# Patient Record
Sex: Female | Born: 1960 | Race: White | Hispanic: No | Marital: Single | Smoking: Former smoker
Health system: Southern US, Community
[De-identification: ages and names within clinical notes are randomized; demographics above are authoritative.]

## PROBLEM LIST (undated history)

## (undated) DIAGNOSIS — F419 Anxiety disorder, unspecified: Secondary | ICD-10-CM

## (undated) DIAGNOSIS — K219 Gastro-esophageal reflux disease without esophagitis: Secondary | ICD-10-CM

## (undated) DIAGNOSIS — F32A Depression, unspecified: Secondary | ICD-10-CM

## (undated) DIAGNOSIS — F329 Major depressive disorder, single episode, unspecified: Secondary | ICD-10-CM

## (undated) DIAGNOSIS — F1910 Other psychoactive substance abuse, uncomplicated: Secondary | ICD-10-CM

## (undated) HISTORY — DX: Major depressive disorder, single episode, unspecified: F32.9

## (undated) HISTORY — DX: Depression, unspecified: F32.A

## (undated) HISTORY — DX: Gastro-esophageal reflux disease without esophagitis: K21.9

## (undated) HISTORY — DX: Other psychoactive substance abuse, uncomplicated: F19.10

## (undated) HISTORY — DX: Anxiety disorder, unspecified: F41.9

---

## 1998-06-12 HISTORY — PX: TUBAL LIGATION: SHX77

## 2003-05-22 ENCOUNTER — Ambulatory Visit (HOSPITAL_COMMUNITY): Admission: RE | Admit: 2003-05-22 | Discharge: 2003-05-22 | Payer: Self-pay | Admitting: Gastroenterology

## 2003-05-22 ENCOUNTER — Encounter (INDEPENDENT_AMBULATORY_CARE_PROVIDER_SITE_OTHER): Payer: Self-pay | Admitting: *Deleted

## 2004-04-18 ENCOUNTER — Ambulatory Visit: Payer: Self-pay | Admitting: Gastroenterology

## 2004-05-19 ENCOUNTER — Ambulatory Visit: Payer: Self-pay | Admitting: Gastroenterology

## 2004-07-04 ENCOUNTER — Ambulatory Visit: Payer: Self-pay | Admitting: Internal Medicine

## 2004-08-01 ENCOUNTER — Ambulatory Visit: Payer: Self-pay | Admitting: Internal Medicine

## 2004-09-01 ENCOUNTER — Ambulatory Visit: Payer: Self-pay | Admitting: Internal Medicine

## 2004-10-27 ENCOUNTER — Ambulatory Visit: Payer: Self-pay | Admitting: Internal Medicine

## 2004-11-24 ENCOUNTER — Ambulatory Visit: Payer: Self-pay | Admitting: Internal Medicine

## 2007-01-14 ENCOUNTER — Other Ambulatory Visit: Admission: RE | Admit: 2007-01-14 | Discharge: 2007-01-14 | Payer: Self-pay | Admitting: Obstetrics & Gynecology

## 2017-11-12 ENCOUNTER — Ambulatory Visit: Payer: Self-pay | Admitting: Physician Assistant

## 2017-11-12 ENCOUNTER — Encounter: Payer: Self-pay | Admitting: Physician Assistant

## 2017-11-12 VITALS — BP 130/82 | HR 99 | Temp 97.9°F | Ht 65.0 in | Wt 133.5 lb

## 2017-11-12 DIAGNOSIS — Z7689 Persons encountering health services in other specified circumstances: Secondary | ICD-10-CM

## 2017-11-12 DIAGNOSIS — Z13220 Encounter for screening for lipoid disorders: Secondary | ICD-10-CM

## 2017-11-12 DIAGNOSIS — Z1211 Encounter for screening for malignant neoplasm of colon: Secondary | ICD-10-CM

## 2017-11-12 DIAGNOSIS — F32A Depression, unspecified: Secondary | ICD-10-CM

## 2017-11-12 DIAGNOSIS — Z1239 Encounter for other screening for malignant neoplasm of breast: Secondary | ICD-10-CM

## 2017-11-12 DIAGNOSIS — F419 Anxiety disorder, unspecified: Secondary | ICD-10-CM

## 2017-11-12 DIAGNOSIS — F329 Major depressive disorder, single episode, unspecified: Secondary | ICD-10-CM

## 2017-11-12 DIAGNOSIS — R0602 Shortness of breath: Secondary | ICD-10-CM

## 2017-11-12 MED ORDER — ALBUTEROL SULFATE (2.5 MG/3ML) 0.083% IN NEBU
2.5000 mg | INHALATION_SOLUTION | Freq: Once | RESPIRATORY_TRACT | Status: AC
  Administered 2017-11-12: 2.5 mg via RESPIRATORY_TRACT

## 2017-11-12 NOTE — Progress Notes (Signed)
BP 130/82 (BP Location: Left Arm, Patient Position: Sitting, Cuff Size: Normal)   Pulse 99   Temp 97.9 F (36.6 C)   Ht 5\' 5"  (1.651 m)   Wt 133 lb 8 oz (60.6 kg)   SpO2 98%   BMI 22.22 kg/m    Subjective:    Patient ID: Linda Hunt, female    DOB: 1961-01-04, 57 y.o.   MRN: [id number]  HPI: Linda MassedMandy G Kevorkian is a 57 y.o. female presenting on 11/12/2017 for New Patient (Initial Visit) (pt [facility]. pt states she has been lethargic, cold, and still has some congestion since last ER visit.)   HPI   Chief Complaint  Patient presents with  . New Patient (Initial Visit)    pt states [facility]. pt states she has been lethargic, cold, and still has some congestion since last ER visit.     Pt says she feels tight in the chest.   She says trouble with breathing for a couple of months.  She stopped smoking about 20 years ago.   No fevers  Pt husband passed away March 3.  They were not living together at the time of his death,   She has lost some weight since that death.  Pt has 10 cats and 2 dogs.  She is getting ready to get another dog.  And she has 6 kittens in addition to the 10 adult cats.   Her father passed in [id number].  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  Her mother passed about 16 years ago.  She gets tearful when talking about her loss.    She has never had counseling for anxiety or depression.    Pt had PCP appointment 2017.  Lab results and PAP report in care everywhere.  Mammogram ordered but pt didn't get that done  records from ER- 11/01/17-  She got rx for amoxil and doxy - pt took them both.  cxr showed possible pneumonia.    Relevant past medical, surgical, family and social history reviewed and updated as indicated. Interim medical history since our last visit reviewed. Allergies and medications reviewed and updated.  No current outpatient medications on file.   Review of Systems   Constitutional: Positive for chills and fatigue. Negative for appetite change, diaphoresis, fever and unexpected weight change.  HENT: Positive for congestion, dental problem, ear pain, hearing loss and sore throat. Negative for drooling, facial swelling, mouth sores, sneezing, trouble swallowing and voice change.   Eyes: Negative for pain, discharge, redness, itching and visual disturbance.  Respiratory: Positive for cough, choking, shortness of breath and wheezing.   Cardiovascular: Positive for chest pain. Negative for palpitations and leg swelling.  Gastrointestinal: Negative for abdominal pain, blood in stool, constipation, diarrhea and vomiting.  Endocrine: Positive for cold intolerance. Negative for heat intolerance and polydipsia.  Genitourinary: Negative for decreased urine volume, dysuria and hematuria.  Musculoskeletal: Positive for arthralgias, back pain and gait problem.  Skin: Negative for rash.  Allergic/Immunologic: Positive for environmental allergies.  Neurological: Positive for light-headedness and headaches. Negative for seizures and syncope.  Hematological: Negative for adenopathy.  Psychiatric/Behavioral: Positive for agitation and dysphoric mood. Negative for suicidal ideas. The patient is nervous/anxious.     Per HPI unless specifically indicated above     Objective:    BP 130/82 (BP Location: Left Arm, Patient Position: Sitting, Cuff Size: Normal)   Pulse 99  Temp 97.9 F (36.6 C)   Ht 5\' 5"  (1.651 m)   Wt 133 lb 8 oz (60.6 kg)   SpO2 98%   BMI 22.22 kg/m   Wt Readings from Last 3 Encounters:  11/12/17 133 lb 8 oz (60.6 kg)    Physical Exam  Constitutional: She is oriented to person, place, and time. She appears well-developed and well-nourished.  HENT:  Head: Normocephalic and atraumatic.  Mouth/Throat: Oropharynx is clear and moist. No oropharyngeal exudate.  Eyes: Pupils are equal, round, and reactive to light. Conjunctivae and EOM are normal.   Neck: Neck supple. No thyromegaly present.  Cardiovascular: Normal rate and regular rhythm.  Pulmonary/Chest: Effort normal and breath sounds normal.  Abdominal: Soft. Bowel sounds are normal. She exhibits no mass. There is no hepatosplenomegaly. There is no tenderness.  Musculoskeletal: She exhibits no edema.  Lymphadenopathy:    She has no cervical adenopathy.  Neurological: She is alert and oriented to person, place, and time. Gait normal.  Skin: Skin is warm and dry.  Psychiatric: She has a normal mood and affect. Her behavior is normal.  Vitals reviewed.   No results found for this or any previous visit.    Assessment & Plan:    Encounter Diagnoses  Name Primary?  . Encounter to establish care Yes  . Shortness of breath   . Screening for breast cancer   . Screening cholesterol level   . Anxiety   . Screening for colon cancer   . Depression, unspecified depression type      -Pt was given nebulizer treatment and states improvement -will Order screening mammogram -Check labs- cbc, cmp, lipids -Repeat cxr -Pt given application for [facility] -encouraged pt to go to [facility] for help with her anxiety and depression.  She agrees counseling could help -Pt given iFOBT for colon cancer screening -pt to follow up 1 month.  RTO sooner prn worsening or new symptoms

## 2017-11-14 ENCOUNTER — Other Ambulatory Visit (HOSPITAL_COMMUNITY)
Admission: RE | Admit: 2017-11-14 | Discharge: 2017-11-14 | Disposition: A | Discharge status: Home / Self Care | Payer: Self-pay | Source: Ambulatory Visit | Attending: Physician Assistant | Admitting: Physician Assistant

## 2017-11-14 ENCOUNTER — Ambulatory Visit (HOSPITAL_COMMUNITY)
Admission: RE | Admit: 2017-11-14 | Discharge: 2017-11-14 | Disposition: A | Discharge status: Home / Self Care | Payer: Self-pay | Source: Ambulatory Visit | Attending: Physician Assistant | Admitting: Physician Assistant

## 2017-11-14 DIAGNOSIS — R0602 Shortness of breath: Secondary | ICD-10-CM

## 2017-11-14 DIAGNOSIS — Z13220 Encounter for screening for lipoid disorders: Secondary | ICD-10-CM | POA: Insufficient documentation

## 2017-11-14 LAB — COMPREHENSIVE METABOLIC PANEL
ALT: 13 U/L — ABNORMAL LOW (ref 14–54)
AST: 20 U/L (ref 15–41)
Albumin: 3.9 g/dL (ref 3.5–5.0)
Alkaline Phosphatase: 47 U/L (ref 38–126)
Anion gap: 5 (ref 5–15)
BUN: 13 mg/dL (ref 6–20)
CO2: 28 mmol/L (ref 22–32)
Calcium: 9.5 mg/dL (ref 8.9–10.3)
Chloride: 109 mmol/L (ref 101–111)
Creatinine, Ser: 0.78 mg/dL (ref 0.44–1.00)
GFR calc Af Amer: >60 mL/min (ref >60)
GFR calc non Af Amer: >60 mL/min (ref >60)
Glucose, Bld: 99 mg/dL (ref 65–99)
Potassium: 3.9 mmol/L (ref 3.5–5.1)
Sodium: 142 mmol/L (ref 135–145)
Total Bilirubin: 0.4 mg/dL (ref 0.3–1.2)
Total Protein: 7.6 g/dL (ref 6.5–8.1)

## 2017-11-14 LAB — CBC
HCT: 41.9 % (ref 36.0–46.0)
Hemoglobin: 13.3 g/dL (ref 12.0–15.0)
MCH: 30.4 pg (ref 26.0–34.0)
MCHC: 31.7 g/dL (ref 30.0–36.0)
MCV: 95.9 fL (ref 78.0–100.0)
Platelets: 408 10*3/uL — ABNORMAL HIGH (ref 150–400)
RBC: 4.37 MIL/uL (ref 3.87–5.11)
RDW: 14.3 % (ref 11.5–15.5)
WBC: 6.8 10*3/uL (ref 4.0–10.5)

## 2017-11-14 LAB — LIPID PANEL
Cholesterol: 151 mg/dL (ref 0–200)
HDL: 45 mg/dL (ref >40)
LDL Cholesterol: 93 mg/dL (ref 0–99)
Total CHOL/HDL Ratio: 3.4 RATIO
Triglycerides: 65 mg/dL (ref <150)
VLDL: 13 mg/dL (ref 0–40)

## 2017-11-18 ENCOUNTER — Other Ambulatory Visit: Payer: Self-pay | Admitting: Physician Assistant

## 2017-11-18 DIAGNOSIS — Z1211 Encounter for screening for malignant neoplasm of colon: Secondary | ICD-10-CM

## 2017-11-20 ENCOUNTER — Other Ambulatory Visit: Payer: Self-pay | Admitting: Physician Assistant

## 2017-11-20 DIAGNOSIS — Z1231 Encounter for screening mammogram for malignant neoplasm of breast: Secondary | ICD-10-CM

## 2017-11-22 ENCOUNTER — Encounter: Payer: Self-pay | Admitting: Physician Assistant

## 2017-12-12 ENCOUNTER — Ambulatory Visit: Payer: Self-pay | Admitting: Physician Assistant

## 2017-12-17 ENCOUNTER — Encounter: Payer: Self-pay | Admitting: Physician Assistant

## 2017-12-20 ENCOUNTER — Ambulatory Visit: Payer: Self-pay | Admitting: Physician Assistant

## 2017-12-20 ENCOUNTER — Encounter: Payer: Self-pay | Admitting: Physician Assistant

## 2017-12-20 VITALS — BP 110/70 | HR 91 | Temp 97.9°F | Ht 65.0 in | Wt 144.0 lb

## 2017-12-20 DIAGNOSIS — F329 Major depressive disorder, single episode, unspecified: Secondary | ICD-10-CM

## 2017-12-20 DIAGNOSIS — F32A Depression, unspecified: Secondary | ICD-10-CM

## 2017-12-20 DIAGNOSIS — F419 Anxiety disorder, unspecified: Secondary | ICD-10-CM
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
## 2018-01-14 ENCOUNTER — Telehealth: Payer: Self-pay | Admitting: Student

## 2018-01-14 NOTE — Telephone Encounter (Signed)
Pt called c/o vaginal itching after having sexual intercourse for the first time after 2 years since losing her husband.   Pt was informed [facility] and if pt suspect that she may have STI/STD she is [facility]. Pt verbalized understanding and states [facility] will [facility].

## 2018-02-26 ENCOUNTER — Ambulatory Visit: Payer: Self-pay | Admitting: Physician Assistant

## 2018-02-26 ENCOUNTER — Encounter: Payer: Self-pay | Admitting: Physician Assistant

## 2018-02-26 ENCOUNTER — Ambulatory Visit (HOSPITAL_COMMUNITY)
Admission: RE | Admit: 2018-02-26 | Discharge: 2018-02-26 | Disposition: A | Discharge status: Home / Self Care | Payer: Self-pay | Source: Ambulatory Visit | Attending: Physician Assistant | Admitting: Physician Assistant

## 2018-02-26 ENCOUNTER — Other Ambulatory Visit (HOSPITAL_COMMUNITY)
Admission: RE | Admit: 2018-02-26 | Discharge: 2018-02-26 | Disposition: A | Discharge status: Home / Self Care | Payer: Self-pay | Source: Ambulatory Visit | Attending: Physician Assistant | Admitting: Physician Assistant

## 2018-02-26 VITALS — BP 136/78 | HR 102 | Temp 98.1°F | Ht 65.0 in | Wt 138.0 lb

## 2018-02-26 DIAGNOSIS — R0789 Other chest pain: Secondary | ICD-10-CM | POA: Insufficient documentation

## 2018-02-26 DIAGNOSIS — F424 Excoriation (skin-picking) disorder: Secondary | ICD-10-CM

## 2018-02-26 DIAGNOSIS — G8929 Other chronic pain: Secondary | ICD-10-CM | POA: Insufficient documentation

## 2018-02-26 DIAGNOSIS — F32A Depression, unspecified: Secondary | ICD-10-CM

## 2018-02-26 DIAGNOSIS — R69 Illness, unspecified: Secondary | ICD-10-CM | POA: Insufficient documentation

## 2018-02-26 DIAGNOSIS — F419 Anxiety disorder, unspecified: Secondary | ICD-10-CM

## 2018-02-26 DIAGNOSIS — L981 Factitial dermatitis: Secondary | ICD-10-CM

## 2018-02-26 DIAGNOSIS — F329 Major depressive disorder, single episode, unspecified: Secondary | ICD-10-CM | POA: Insufficient documentation

## 2018-02-26 MED ORDER — TRIAMCINOLONE ACETONIDE 0.025 % EX OINT: 1.0000 "application " | TOPICAL_OINTMENT | Freq: Two times a day (BID) | CUTANEOUS | 0 refills | Status: AC | PRN
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
## 2018-06-20 ENCOUNTER — Ambulatory Visit: Payer: Self-pay | Admitting: Physician Assistant

## 2018-07-03 ENCOUNTER — Encounter: Payer: Self-pay | Admitting: Physician Assistant

## 2018-09-05 ENCOUNTER — Telehealth: Payer: Self-pay | Admitting: Physician Assistant

## 2018-12-03 ENCOUNTER — Telehealth: Payer: Self-pay | Admitting: Physician Assistant

## 2019-01-02 IMAGING — DX DG CHEST 2V
2 series · 2 of 2 positions shown · non-contrast
Comparison: 11/14/2017

CLINICAL DATA: Chest wall pain

EXAM:
CHEST - 2 VIEW

[chest pa]
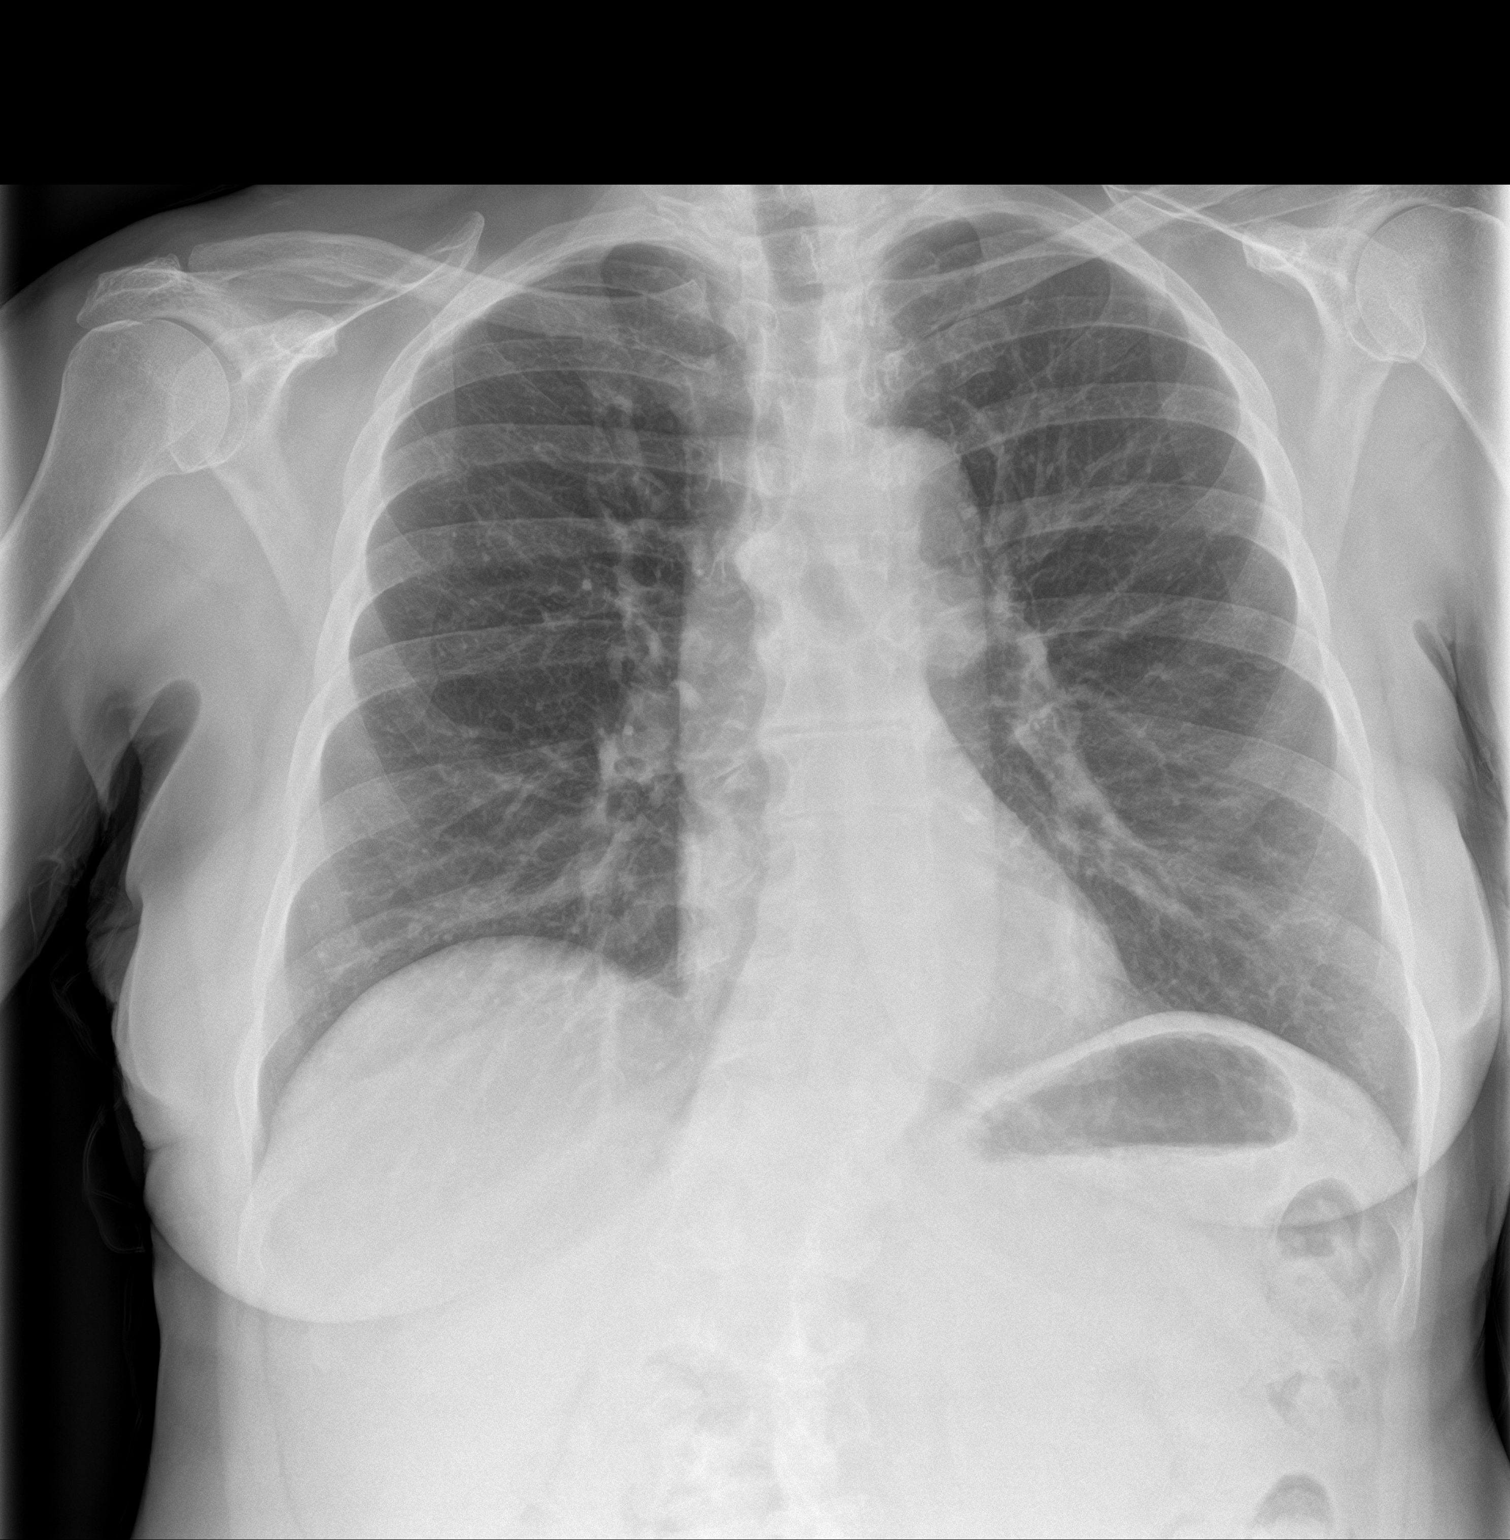

[chest lat]
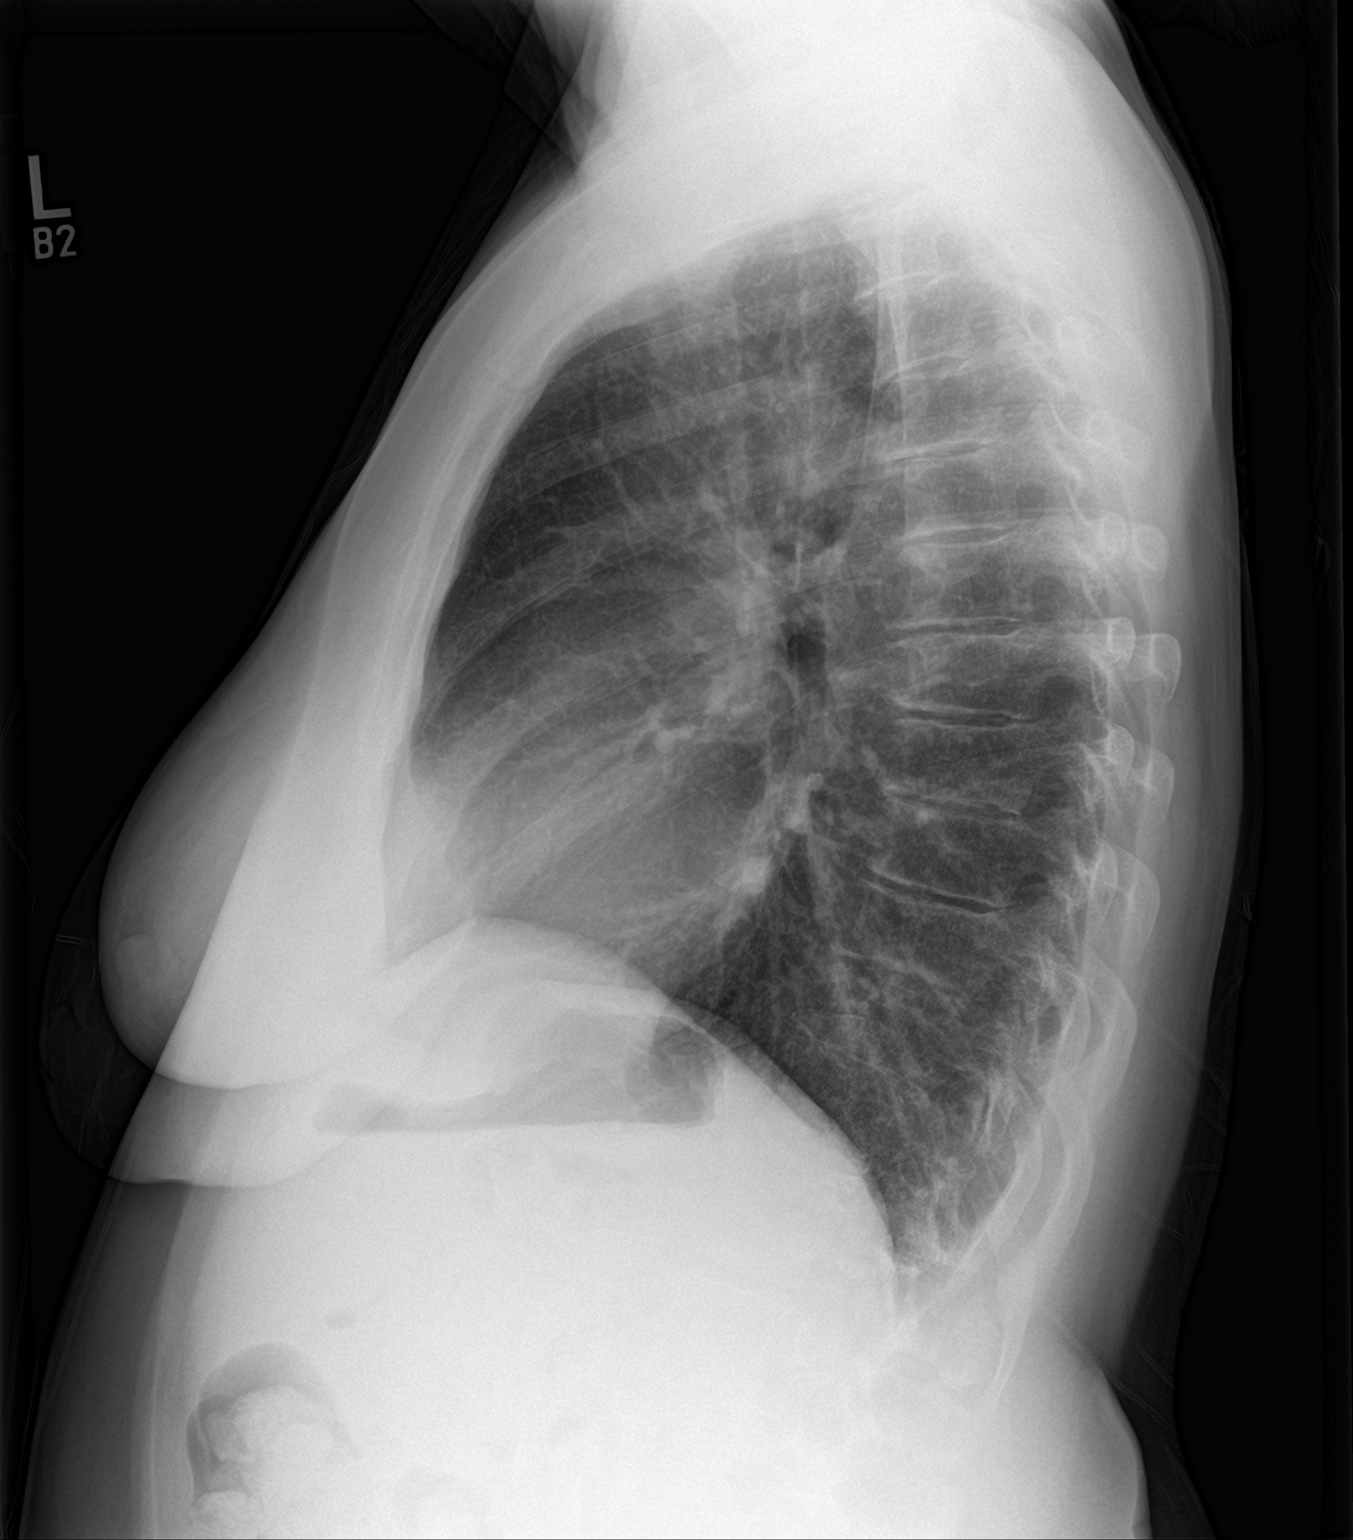

[2 of 2 positions shown; findings below may reference images not displayed]

FINDINGS: Normal heart size and mediastinal contours. There is no edema,
consolidation, effusion, or pneumothorax. S shaped thoracolumbar
scoliosis. No acute osseous finding.
IMPRESSION: No evidence of active disease.

## 2023-02-07 ENCOUNTER — Ambulatory Visit: Payer: Self-pay | Admitting: Nurse Practitioner

## 2023-04-02 ENCOUNTER — Other Ambulatory Visit: Payer: Self-pay | Admitting: Nurse Practitioner

## 2023-04-02 ENCOUNTER — Telehealth: Payer: Self-pay | Admitting: Nurse Practitioner

## 2023-04-02 ENCOUNTER — Encounter: Payer: Self-pay | Admitting: Nurse Practitioner

## 2023-04-02 ENCOUNTER — Ambulatory Visit: Payer: MEDICAID | Admitting: Nurse Practitioner

## 2023-04-02 VITALS — BP 123/78 | HR 100 | Temp 97.3°F | Ht 65.0 in | Wt 147.0 lb

## 2023-04-02 DIAGNOSIS — Z1211 Encounter for screening for malignant neoplasm of colon: Secondary | ICD-10-CM

## 2023-04-02 DIAGNOSIS — F321 Major depressive disorder, single episode, moderate: Secondary | ICD-10-CM

## 2023-04-02 DIAGNOSIS — Z0001 Encounter for general adult medical examination with abnormal findings: Secondary | ICD-10-CM | POA: Insufficient documentation

## 2023-04-02 DIAGNOSIS — Z23 Encounter for immunization: Secondary | ICD-10-CM

## 2023-04-02 DIAGNOSIS — M5441 Lumbago with sciatica, right side: Secondary | ICD-10-CM

## 2023-04-02 DIAGNOSIS — Z1231 Encounter for screening mammogram for malignant neoplasm of breast: Secondary | ICD-10-CM

## 2023-04-02 DIAGNOSIS — J301 Allergic rhinitis due to pollen: Secondary | ICD-10-CM | POA: Diagnosis not present

## 2023-04-02 DIAGNOSIS — G8929 Other chronic pain: Secondary | ICD-10-CM

## 2023-04-02 DIAGNOSIS — B1920 Unspecified viral hepatitis C without hepatic coma: Secondary | ICD-10-CM

## 2023-04-02 MED ORDER — ESCITALOPRAM OXALATE 10 MG PO TABS: 10.0000 mg | ORAL_TABLET | Freq: Every day | ORAL | 1 refills | Status: AC

## 2023-04-02 MED ORDER — TIZANIDINE HCL 2 MG PO TABS: 2.0000 mg | ORAL_TABLET | Freq: Once | ORAL | 0 refills | Status: DC | PRN

## 2023-04-02 MED ORDER — TIZANIDINE HCL 2 MG PO CAPS: 2.0000 mg | ORAL_CAPSULE | Freq: Two times a day (BID) | ORAL | 0 refills | Status: DC | PRN

## 2023-04-02 MED ORDER — FLUTICASONE PROPIONATE 50 MCG/ACT NA SUSP: 2.0000 | Freq: Every day | NASAL | 6 refills | Status: AC

## 2023-04-02 NOTE — Telephone Encounter (Signed)
Pharmacy aware

## 2023-04-02 NOTE — Telephone Encounter (Signed)
Pt [facility] insurance pays for TABLETS not tizanidine (ZANAFLEX) 2 MG CAPSULE. PLEASE fix

## 2023-04-02 NOTE — Progress Notes (Signed)
New Patient Office Visit  Subjective    Patient ID: Linda Hunt, female    DOB: 10-Oct-1960  Age: 62 y.o. MRN: [id number]  CC:  Chief Complaint  Patient presents with   Establish Care   Nasal Congestion    Having allergy symptoms for past few days    HPI Linda Hunt is a 62 yrs old female presents 04/02/23 to establish care, and c/o of back pain that radiate right lower leg She has not seen a doctor in 10 years. She reports experiencing back pain for the past 6 months, which radiates down her right leg but denies any loss of bowel or bladder control. She has a past medical history of depression and was previously on Wellbutrin but stopped taking it. The patient is agreeable to restarting treatment with Lexapro (escitalopram) for her depression.  Additionally, she is due for a colonoscopy and has agreed to the Cologuard screening. She has never had a mammogram, and a referral has been sent for that as well. The patient expresses a desire to address her health concerns and follow up on preventive care.3  Outpatient Encounter Medications as of 04/02/2023  Medication Sig   escitalopram (LEXAPRO) 10 MG tablet Take 1 tablet (10 mg total) by mouth daily.   fluticasone (FLONASE) 50 MCG/ACT nasal spray Place 2 sprays into both nostrils daily.   tizanidine (ZANAFLEX) 2 MG capsule Take 1 capsule (2 mg total) by mouth 2 (two) times daily as needed for muscle spasms.   triamcinolone (KENALOG) 0.025 % ointment Apply 1 application topically 2 (two) times daily as needed. (Patient not taking: Reported on 04/02/2023)   No facility-administered encounter medications on file as of 04/02/2023.    Past Medical History:  Diagnosis Date   Anxiety    Depression    GERD (gastroesophageal reflux disease)    Substance abuse (HCC)     Past Surgical History:  Procedure Laterality Date   TUBAL LIGATION  2000    Family History  Problem Relation Age of Onset   Heart disease Mother    Stroke  Mother    GER disease Father     Social History   Socioeconomic History   Marital status: Single    Spouse name: Not on file   Number of children: Not on file   Years of education: Not on file   Highest education level: Not on file  Occupational History   Not on file  Tobacco Use   Smoking status: Former    Current packs/day: 0.00    Average packs/day: 0.5 packs/day for 10.0 years (5.0 ttl pk-yrs)    Types: Cigarettes    Start date: 06/12/1986    Quit date: 06/12/1996    Years since quitting: 26.8   Smokeless tobacco: Never  Vaping Use   Vaping status: Never Used  Substance and Sexual Activity   Alcohol use: Not Currently   Drug use: Not Currently    Types: Marijuana, Cocaine    Comment: last marijuana use 08/2017. last cocaine use 2018    Sexual activity: Not on file  Other Topics Concern   Not on file  Social History Narrative   Not on file   Social Determinants of Health   Financial Resource Strain: Not on file  Food Insecurity: Food Insecurity Present (04/02/2023)   Hunger Vital Sign    Worried About Running Out of Food in the Last Year: Sometimes true    Ran Out of Food in the Last Year: Sometimes  true  Transportation Needs: No Transportation Needs (04/02/2023)   PRAPARE - Administrator, Civil Service (Medical): No    Lack of Transportation (Non-Medical): No  Physical Activity: Not on file  Stress: Not on file  Social Connections: Not on file  Intimate Partner Violence: Not At Risk (04/02/2023)   Humiliation, Afraid, Rape, and Kick questionnaire    Fear of Current or Ex-Partner: No    Emotionally Abused: No    Physically Abused: No    Sexually Abused: No    Review of Systems  Constitutional:  Negative for chills and fever.  HENT:  Negative for sore throat and tinnitus.   Eyes:  Negative for discharge.  Respiratory:  Negative for cough and shortness of breath.   Cardiovascular:  Negative for chest pain and leg swelling.  Gastrointestinal:   Negative for blood in stool, melena, nausea and vomiting.  Genitourinary:  Negative for frequency and urgency.  Musculoskeletal:  Positive for back pain. Negative for falls.  Skin:  Negative for itching and rash.  Neurological:  Negative for dizziness and weakness.  Endo/Heme/Allergies:  Negative for environmental allergies. Does not bruise/bleed easily.  Psychiatric/Behavioral:  Negative for hallucinations and suicidal ideas.    Negative unless indicated in HPI   Objective    BP 123/78   Pulse 100   Temp (!) 97.3 F (36.3 C) (Temporal)   Ht 5\' 5"  (1.651 m)   Wt 147 lb (66.7 kg)   SpO2 97%   BMI 24.46 kg/m   Physical Exam Vitals and nursing note reviewed.  Constitutional:      Appearance: Normal appearance.  HENT:     Head: Normocephalic and atraumatic.     Right Ear: Tympanic membrane, ear canal and external ear normal.     Left Ear: Tympanic membrane, ear canal and external ear normal.  Eyes:     General: No scleral icterus.    Extraocular Movements: Extraocular movements intact.     Conjunctiva/sclera: Conjunctivae normal.     Pupils: Pupils are equal, round, and reactive to light.  Cardiovascular:     Rate and Rhythm: Normal rate and regular rhythm.  Pulmonary:     Effort: Pulmonary effort is normal.     Breath sounds: Normal breath sounds.  Abdominal:     General: Bowel sounds are normal.     Palpations: Abdomen is soft.  Musculoskeletal:        General: Normal range of motion.     Cervical back: Normal range of motion and neck supple.     Right lower leg: No edema.     Left lower leg: No edema.  Skin:    General: Skin is warm and dry.     Findings: No rash.  Neurological:     Mental Status: She is alert and oriented to person, place, and time. Mental status is at baseline.  Psychiatric:        Mood and Affect: Mood normal.        Behavior: Behavior normal.        Thought Content: Thought content normal.        Judgment: Judgment normal.     Last  CBC Lab Results  Component Value Date   WBC 6.8 11/14/2017   HGB 13.3 11/14/2017   HCT 41.9 11/14/2017   MCV 95.9 11/14/2017   MCH 30.4 11/14/2017   RDW 14.3 11/14/2017   PLT 408 (H) 11/14/2017   Last metabolic panel Lab Results  Component Value Date  GLUCOSE 99 11/14/2017   NA 142 11/14/2017   K 3.9 11/14/2017   CL 109 11/14/2017   CO2 28 11/14/2017   BUN 13 11/14/2017   CREATININE 0.78 11/14/2017   GFRNONAA >60 11/14/2017   CALCIUM 9.5 11/14/2017   PROT 7.6 11/14/2017   ALBUMIN 3.9 11/14/2017   BILITOT 0.4 11/14/2017   ALKPHOS 47 11/14/2017   AST 20 11/14/2017   ALT 13 (L) 11/14/2017   ANIONGAP 5 11/14/2017   Last lipids Lab Results  Component Value Date   CHOL 151 11/14/2017   HDL 45 11/14/2017   LDLCALC 93 11/14/2017   TRIG 65 11/14/2017   CHOLHDL 3.4 11/14/2017     Assessment & Plan:  Non-seasonal allergic rhinitis due to pollen -     Fluticasone Propionate; Place 2 sprays into both nostrils daily.  Dispense: 16 g; Refill: 6  Screening for colon cancer -     Cologuard  Screening mammogram for breast cancer -     3D Screening Mammogram w/Implants, Left and Right  Encounter for general adult medical examination with abnormal findings -     CBC with Differential/Platelet -     CMP14+EGFR -     Lipid panel -     Thyroid Panel With TSH -     HepB+HepC+HIV Panel  Current moderate episode of major depressive disorder, unspecified whether recurrent (HCC) -     Thyroid Panel With TSH -     Escitalopram Oxalate; Take 1 tablet (10 mg total) by mouth daily.  Dispense: 30 tablet; Refill: 1  Chronic right-sided low back pain with right-sided sciatica -     tiZANidine HCl; Take 1 capsule (2 mg total) by mouth 2 (two) times daily as needed for muscle spasms.  Dispense: 30 capsule; Refill: 0   Linda Hunt is a 62 yrs old female, no acute distress - back pain: starts Zanaflex PRN  heat PRN MD: start on Lexapro 10 mg 1-bay Labs: Cbc, CMP, Lipid, TSH Mammogram  order Cologuard order Vacc: Flu vaccin Encourage healthy lifestyle choices, including diet (rich in fruits, vegetables, and lean proteins, and low in salt and simple carbohydrates) and exercise (at least 30 minutes of moderate physical activity daily).     The above assessment and management plan was discussed with the patient. The patient verbalized understanding of and has agreed to the management plan. Patient is aware to call the clinic if they develop any new symptoms or if symptoms persist or worsen. Patient is aware when to return to the clinic for a follow-up visit. Patient educated on when it is appropriate to go to the emergency department.  Return in about 6 months (around 10/01/2023) for physical with pap.     Arrie Aran Santa Lighter, DNP [facility] [address] [place], [place] [address] [phone]

## 2023-04-03 ENCOUNTER — Other Ambulatory Visit: Payer: Self-pay | Admitting: Nurse Practitioner

## 2023-04-03 DIAGNOSIS — B1920 Unspecified viral hepatitis C without hepatic coma: Secondary | ICD-10-CM

## 2023-04-03 DIAGNOSIS — E781 Pure hyperglyceridemia: Secondary | ICD-10-CM

## 2023-04-03 LAB — HEPB+HEPC+HIV PANEL
HIV Screen 4th Generation wRfx: NONREACTIVE
Hep B C IgM: NEGATIVE
Hep B Core Total Ab: NEGATIVE
Hep B E Ab: NONREACTIVE
Hep B E Ag: NEGATIVE
Hep B Surface Ab, Qual: REACTIVE
Hep C Virus Ab: REACTIVE — AB
Hepatitis B Surface Ag: NEGATIVE

## 2023-04-03 LAB — CBC WITH DIFFERENTIAL/PLATELET
Basophils Absolute: 0 10*3/uL (ref 0.0–0.2)
Basos: 1 %
EOS (ABSOLUTE): 0.2 10*3/uL (ref 0.0–0.4)
Eos: 3 %
Hematocrit: 40.2 % (ref 34.0–46.6)
Hemoglobin: 12.8 g/dL (ref 11.1–15.9)
Immature Grans (Abs): 0 10*3/uL (ref 0.0–0.1)
Immature Granulocytes: 0 %
Lymphocytes Absolute: 2.3 10*3/uL (ref 0.7–3.1)
Lymphs: 40 %
MCH: 30 pg (ref 26.6–33.0)
MCHC: 31.8 g/dL (ref 31.5–35.7)
MCV: 94 fL (ref 79–97)
Monocytes Absolute: 0.6 10*3/uL (ref 0.1–0.9)
Monocytes: 11 %
Neutrophils Absolute: 2.6 10*3/uL (ref 1.4–7.0)
Neutrophils: 45 %
Platelets: 280 10*3/uL (ref 150–450)
RBC: 4.26 x10E6/uL (ref 3.77–5.28)
RDW: 13.8 % (ref 11.7–15.4)
WBC: 5.6 10*3/uL (ref 3.4–10.8)

## 2023-04-03 LAB — CMP14+EGFR
ALT: 35 [IU]/L — ABNORMAL HIGH (ref 0–32)
AST: 46 [IU]/L — ABNORMAL HIGH (ref 0–40)
Albumin: 3.9 g/dL (ref 3.9–4.9)
Alkaline Phosphatase: 59 [IU]/L (ref 44–121)
BUN/Creatinine Ratio: 14 (ref 12–28)
BUN: 11 mg/dL (ref 8–27)
Bilirubin Total: 0.2 mg/dL (ref 0.0–1.2)
CO2: 25 mmol/L (ref 20–29)
Calcium: 8.6 mg/dL — ABNORMAL LOW (ref 8.7–10.3)
Chloride: 104 mmol/L (ref 96–106)
Creatinine, Ser: 0.76 mg/dL (ref 0.57–1.00)
Globulin, Total: 3.2 g/dL (ref 1.5–4.5)
Glucose: 96 mg/dL (ref 70–99)
Potassium: 4 mmol/L (ref 3.5–5.2)
Sodium: 140 mmol/L (ref 134–144)
Total Protein: 7.1 g/dL (ref 6.0–8.5)
eGFR: 89 mL/min/{1.73_m2} (ref >59)

## 2023-04-03 LAB — THYROID PANEL WITH TSH
Free Thyroxine Index: 2.1 (ref 1.2–4.9)
T3 Uptake Ratio: 28 % (ref 24–39)
T4, Total: 7.5 ug/dL (ref 4.5–12.0)
TSH: 0.425 u[IU]/mL — ABNORMAL LOW (ref 0.450–4.500)

## 2023-04-03 LAB — LIPID PANEL
Chol/HDL Ratio: 3.5 ratio (ref 0.0–4.4)
Cholesterol, Total: 134 mg/dL (ref 100–199)
HDL: 38 mg/dL — ABNORMAL LOW (ref >39)
LDL Chol Calc (NIH): 65 mg/dL (ref 0–99)
Triglycerides: 185 mg/dL — ABNORMAL HIGH (ref 0–149)
VLDL Cholesterol Cal: 31 mg/dL (ref 5–40)

## 2023-04-03 MED ORDER — FENOFIBRATE 48 MG PO TABS: 48.0000 mg | ORAL_TABLET | Freq: Every day | ORAL | 0 refills | Status: AC

## 2023-04-03 NOTE — Addendum Note (Signed)
Addended by: Daisy Blossom on: 04/03/2023 09:18 AM   Modules accepted: Orders

## 2023-04-04 LAB — SPECIMEN STATUS REPORT

## 2023-04-04 LAB — HCV RNA DIAGNOSIS, NAA
HCV RNA, Quantitation: 24 [IU]/mL
HCV RNA, log10: 1.38 {Log}

## 2023-05-02 ENCOUNTER — Encounter: Payer: Self-pay | Admitting: Nurse Practitioner

## 2023-05-03 ENCOUNTER — Telehealth: Payer: Self-pay

## 2023-05-03 DIAGNOSIS — G8929 Other chronic pain: Secondary | ICD-10-CM

## 2023-05-03 NOTE — Telephone Encounter (Signed)
Copied from CRM [id number]. Topic: Referral - Request for Referral >> May 03, 2023 10:16 AM Cassiday T wrote: Did the patient discuss referral with their provider in the last year? No (If No - schedule appointment) (If Yes - send message)  Appointment offered? Yes  Type of order/referral and detailed reason for visit: chiropractor   Preference of office, provider, location:   If referral order, have you been seen by this specialty before? No (If Yes, this issue or another issue? When? Where?  Can we respond through MyChart? No

## 2023-05-04 NOTE — Addendum Note (Signed)
Addended by: Ignacia Bayley on: 05/04/2023 02:33 PM   Modules accepted: Orders

## 2023-05-04 NOTE — Telephone Encounter (Signed)
Referral placed.

## 2023-05-05 ENCOUNTER — Other Ambulatory Visit: Payer: Self-pay | Admitting: Nurse Practitioner

## 2023-05-05 DIAGNOSIS — G8929 Other chronic pain: Secondary | ICD-10-CM

## 2023-05-16 ENCOUNTER — Telehealth: Payer: Self-pay | Admitting: Nurse Practitioner

## 2023-05-16 NOTE — Telephone Encounter (Signed)
I can't see any info on behavioral health. Can someone who is authorized to check on this, advise?  Copied from CRM [id number]. Topic: Referral - Question >> May 16, 2023  1:43 PM Dimitri Ped wrote: Reason for CRM: patient is calling concerning a referral to behavioral health. Patient is asking do she need a referral or can she just go and make a appointment . Cal back number [phone]

## 2023-08-29 ENCOUNTER — Encounter: Payer: Self-pay | Admitting: *Deleted

## 2023-09-22 NOTE — Progress Notes (Deleted)
   Complete physical exam  Patient: Linda Hunt   DOB: 1960/10/11   63 y.o. Female  MRN: [id number]  Subjective:    No chief complaint on file.   Linda Hunt is a 63 y.o. female who presents today for a complete physical exam. She reports consuming a {diet types:17450} diet. {types:19826} She generally feels {DESC; WELL/FAIRLY WELL/POORLY:18703}. She reports sleeping {DESC; WELL/FAIRLY WELL/POORLY:18703}. She {does/does not:200015} have additional problems to discuss today.    Most recent fall risk assessment:    04/02/2023    4:09 PM  Fall Risk   Falls in the past year? 0  Number falls in past yr: 0  Injury with Fall? 0  Risk for fall due to : No Fall Risks  Follow up Falls evaluation completed     Most recent depression screenings:    04/02/2023    4:08 PM  PHQ 2/9 Scores  PHQ - 2 Score 0  PHQ- 9 Score 10    {VISON DENTAL STD PSA (Optional):27386}  {History (Optional):23778}  Patient Care Team: Villa Greaser, Adell Hones, NP as PCP - General (Nurse Practitioner)   Outpatient Medications Prior to Visit  Medication Sig   escitalopram (LEXAPRO) 10 MG tablet Take 1 tablet (10 mg total) by mouth daily.   fenofibrate (TRICOR) 48 MG tablet Take 1 tablet (48 mg total) by mouth daily.   fluticasone (FLONASE) 50 MCG/ACT nasal spray Place 2 sprays into both nostrils daily.   tiZANidine (ZANAFLEX) 2 MG tablet TAKE 1 TABLET BY MOUTH TWICE DAILY AS NEEDED FOR MUSCLE SPASM   triamcinolone (KENALOG) 0.025 % ointment Apply 1 application topically 2 (two) times daily as needed. (Patient not taking: Reported on 04/02/2023)   No facility-administered medications prior to visit.    ROS Negative unless indicated in HPI    Objective:     There were no vitals taken for this visit. {Vitals History (Optional):23777}  Physical Exam   No results found for any visits on 09/27/23. {Show previous labs (optional):23779}    Assessment & Plan:    Routine Health Maintenance  and Physical Exam  Discussed health benefits of physical activity, and encouraged her to engage in regular exercise appropriate for her age and condition.  There are no diagnoses linked to this encounter.  No follow-ups on file.     @Reynol Arnone  [place], [place]

## 2023-09-27 ENCOUNTER — Encounter: Payer: MEDICAID | Admitting: Nurse Practitioner

## 2023-10-01 ENCOUNTER — Encounter: Payer: MEDICAID | Admitting: Nurse Practitioner

## 2023-11-14 ENCOUNTER — Telehealth: Payer: Self-pay | Admitting: Nurse Practitioner

## 2024-03-31 ENCOUNTER — Other Ambulatory Visit: Payer: Self-pay | Admitting: Nurse Practitioner

## 2024-03-31 DIAGNOSIS — B1920 Unspecified viral hepatitis C without hepatic coma: Secondary | ICD-10-CM
